# Patient Record
Sex: Male | Born: 1960 | Race: White | Hispanic: No | Marital: Married | Smoking: Current every day smoker
Health system: Southern US, Community
[De-identification: ages and names within clinical notes are randomized; demographics above are authoritative.]

---

## 2018-06-06 ENCOUNTER — Emergency Department (INDEPENDENT_AMBULATORY_CARE_PROVIDER_SITE_OTHER)
Admission: EM | Admit: 2018-06-06 | Discharge: 2018-06-06 | Disposition: A | Discharge status: Home / Self Care | Payer: BLUE CROSS/BLUE SHIELD | Source: Home / Self Care | Attending: Family Medicine | Admitting: Family Medicine

## 2018-06-06 ENCOUNTER — Emergency Department (INDEPENDENT_AMBULATORY_CARE_PROVIDER_SITE_OTHER): Payer: BLUE CROSS/BLUE SHIELD

## 2018-06-06 ENCOUNTER — Other Ambulatory Visit: Payer: Self-pay

## 2018-06-06 DIAGNOSIS — R42 Dizziness and giddiness: Secondary | ICD-10-CM

## 2018-06-06 DIAGNOSIS — J323 Chronic sphenoidal sinusitis: Secondary | ICD-10-CM

## 2018-06-06 DIAGNOSIS — J0130 Acute sphenoidal sinusitis, unspecified: Secondary | ICD-10-CM

## 2018-06-06 MED ORDER — MECLIZINE HCL 25 MG PO TABS: 25.0000 mg | ORAL_TABLET | Freq: Three times a day (TID) | ORAL | 0 refills | Status: AC | PRN

## 2018-06-06 MED ORDER — AMOXICILLIN-POT CLAVULANATE 875-125 MG PO TABS: 1.0000 | ORAL_TABLET | Freq: Two times a day (BID) | ORAL | 0 refills | Status: AC

## 2018-06-06 NOTE — ED Provider Notes (Signed)
[facility]    CSN: [id number] Arrival date & time: 06/06/18  1139     History   Chief Complaint Chief Complaint  Patient presents with  . Dizziness  . Nausea  . Otalgia  . Facial Pain    HPI Joshua Rubio is a 57 y.o. male.   HPI  Joshua Rubio is a 57 y.o. male presenting to UC with c/o dizziness that started 2 days ago.  Pt states he had been working under a car on Thursday and Friday of last week.  The next morning he developed dizziness in the middle of the night with severe nausea and headache.  The dizziness resolved within 2 hours and he felt a "flush" feeling all over his body.  Dizziness and nausea have gradually subsided but he still feels slightly nauseated and dizzy with associated Right sided ear pressure, nasal congestion and Right sided sinus pressure. Denies fever, chills, vomiting or diarrhea. Denies known sick contacts. He has not seen a PCP in "years."  He has been told his BP has been high but has never been put on medication. No prior hx of stroke. No family hx of stroke. No hx of migraine or vertigo but states he has had similar dizzy sensation while initially getting out from working under cars but it typically resolves within a few minutes. No family hx of migraines.    History reviewed. No pertinent past medical history.  There are no active problems to display for this patient.   History reviewed. No pertinent surgical history.     Home Medications    Prior to Admission medications   Medication Sig Start Date End Date Taking? Authorizing Provider  methadone (DOLOPHINE) 10 MG tablet Take 125 mg by mouth daily.   Yes [provider]  amoxicillin-clavulanate (AUGMENTIN) 875-125 MG tablet Take 1 tablet by mouth 2 (two) times daily. One po bid x 7 days 06/06/18   Lurene Shadow, PA-C  meclizine (ANTIVERT) 25 MG tablet Take 1 tablet (25 mg total) by mouth 3 (three) times daily as needed for dizziness. 06/06/18   Lurene Shadow,  PA-C    Family History History reviewed. No pertinent family history.  Social History Social History   Tobacco Use  . Smoking status: Current Every Day Smoker    Packs/day: 2.00    Types: Cigarettes  Substance Use Topics  . Alcohol use: Not Currently  . Drug use: Not Currently     Allergies   Patient has no known allergies.   Review of Systems Review of Systems  Constitutional: Negative for chills and fever.  HENT: Positive for congestion, ear pain and sinus pressure. Negative for sinus pain and sore throat.   Eyes: Negative for photophobia and visual disturbance.  Respiratory: Negative for cough and chest tightness.   Cardiovascular: Negative for chest pain and palpitations.  Gastrointestinal: Positive for nausea. Negative for vomiting.  Musculoskeletal: Negative for myalgias, neck pain and neck stiffness.  Neurological: Positive for dizziness and headaches. Negative for tremors, seizures, syncope, facial asymmetry, speech difficulty, weakness, light-headedness and numbness.     Physical Exam Triage Vital Signs ED Triage Vitals  Enc Vitals Group     BP 06/06/18 1232 (!) 159/92     Pulse Rate 06/06/18 1232 66     Resp --      Temp 06/06/18 1232 98 F (36.7 C)     Temp Source 06/06/18 1232 Oral     SpO2 06/06/18 1232 96 %  Weight 06/06/18 1233 251 lb (113.9 kg)     Height 06/06/18 1233 6\' 3"  (1.905 m)     Head Circumference --      Peak Flow --      Pain Score 06/06/18 1233 0     Pain Loc --      Pain Edu? --      Excl. in GC? --    No data found.  Updated Vital Signs BP (!) 159/92 (BP Location: Right Arm)   Pulse 66   Temp 98 F (36.7 C) (Oral)   Ht 6\' 3"  (1.905 m)   Wt 251 lb (113.9 kg)   SpO2 96%   BMI 31.37 kg/m   Visual Acuity Right Eye Distance:   Left Eye Distance:   Bilateral Distance:    Right Eye Near:   Left Eye Near:    Bilateral Near:     Physical Exam  Constitutional: He is oriented to person, place, and time. He appears  well-developed and well-nourished. No distress.  HENT:  Head: Normocephalic and atraumatic.  Right Ear: Tympanic membrane normal.  Left Ear: Tympanic membrane normal.  Nose: Mucosal edema present. Right sinus exhibits maxillary sinus tenderness. Right sinus exhibits no frontal sinus tenderness. Left sinus exhibits no maxillary sinus tenderness and no frontal sinus tenderness.  Mouth/Throat: Uvula is midline, oropharynx is clear and moist and mucous membranes are normal.  Eyes: Pupils are equal, round, and reactive to light. Conjunctivae are normal. Right eye exhibits nystagmus. Left eye exhibits nystagmus.  Slight horizontal nystagmus to the Right  Neck: Normal range of motion. Neck supple.  Cardiovascular: Normal rate and regular rhythm.  Pulmonary/Chest: Effort normal and breath sounds normal. No stridor. No respiratory distress. He has no wheezes. He has no rales.  Musculoskeletal: Normal range of motion.  Neurological: He is alert and oriented to person, place, and time. No cranial nerve deficit.  CN II-XII in tact. Speech is clear. Alert to person, place and time. Normal finger to nose coordination. Negative pronator drift. Normal gait.   Skin: Skin is warm and dry. He is not diaphoretic.  Psychiatric: He has a normal mood and affect. His behavior is normal.  Nursing note and vitals reviewed.    UC Treatments / Results  Labs (all labs ordered are listed, but only abnormal results are displayed) Labs Reviewed - No data to display  EKG None  Radiology Ct Head Wo Contrast  Result Date: 06/06/2018 CLINICAL DATA:  Headaches, right maxillary pressure. EXAM: CT HEAD WITHOUT CONTRAST CT MAXILLOFACIAL WITHOUT CONTRAST TECHNIQUE: Multidetector CT imaging of the head and maxillofacial structures were performed using the standard protocol without intravenous contrast. Multiplanar CT image reconstructions of the maxillofacial structures were also generated. COMPARISON:  None. FINDINGS: CT  HEAD FINDINGS Brain: No evidence of acute infarction, hemorrhage, hydrocephalus, extra-axial collection or mass lesion/mass effect. Vascular: No hyperdense vessel or unexpected calcification. Skull: Normal. Negative for fracture or focal lesion. Other: None. CT MAXILLOFACIAL FINDINGS Osseous: No fracture is noted in the maxillofacial region. Mandible is not included in the field-of-view. Orbits: Negative. No traumatic or inflammatory finding. Sinuses: Left sphenoid sinusitis is noted. Remaining paranasal sinuses are unremarkable. Soft tissues: Negative. IMPRESSION: Normal head CT. Left sphenoid sinusitis. No other abnormality seen in visualized maxillofacial region. Electronically Signed   By: Lupita Raider, M.D.   On: 06/06/2018 14:08   Ct Maxillofacial Wo Contrast  Result Date: 06/06/2018 CLINICAL DATA:  Headaches, right maxillary pressure. EXAM: CT HEAD WITHOUT CONTRAST CT MAXILLOFACIAL WITHOUT  CONTRAST TECHNIQUE: Multidetector CT imaging of the head and maxillofacial structures were performed using the standard protocol without intravenous contrast. Multiplanar CT image reconstructions of the maxillofacial structures were also generated. COMPARISON:  None. FINDINGS: CT HEAD FINDINGS Brain: No evidence of acute infarction, hemorrhage, hydrocephalus, extra-axial collection or mass lesion/mass effect. Vascular: No hyperdense vessel or unexpected calcification. Skull: Normal. Negative for fracture or focal lesion. Other: None. CT MAXILLOFACIAL FINDINGS Osseous: No fracture is noted in the maxillofacial region. Mandible is not included in the field-of-view. Orbits: Negative. No traumatic or inflammatory finding. Sinuses: Left sphenoid sinusitis is noted. Remaining paranasal sinuses are unremarkable. Soft tissues: Negative. IMPRESSION: Normal head CT. Left sphenoid sinusitis. No other abnormality seen in visualized maxillofacial region. Electronically Signed   By: Lupita RaiderJames  Green Jr, M.D.   On: 06/06/2018 14:08     Procedures Procedures (including critical care time)  Medications Ordered in UC Medications - No data to display  Initial Impression / Assessment and Plan / UC Course  I have reviewed the triage vital signs and the nursing notes.  Pertinent labs & imaging results that were available during my care of the patient were reviewed by me and considered in my medical decision making (see chart for details).     Discussed imaging with pt.  Normal neuro exam. No evidence of emergent process taking place at this time.  Dizziness c/w vertigo, likely also due to sphenoid sinusitis. Will tx with Augmentin and Meclizine. Discussed symptoms that warrant emergent care in the ED.   Final Clinical Impressions(s) / UC Diagnoses   Final diagnoses:  Dizziness  Acute non-recurrent sphenoidal sinusitis     Discharge Instructions      Please call to schedule a follow up exam with a family medicine provider this week.  Please call 911 or go to the hospital if symptoms worsening- if headache returns, one sided weakness or numbness, worsening dizziness, confusion, change in speech or balance, or other new concerning symptoms develop.    ED Prescriptions    Medication Sig Dispense Auth. Provider   amoxicillin-clavulanate (AUGMENTIN) 875-125 MG tablet Take 1 tablet by mouth 2 (two) times daily. One po bid x 7 days 14 tablet Derron Pipkins O, PA-C   meclizine (ANTIVERT) 25 MG tablet Take 1 tablet (25 mg total) by mouth 3 (three) times daily as needed for dizziness. 30 tablet Lurene ShadowPhelps, Jamarri Vuncannon O, PA-C     Controlled Substance Prescriptions [place] Controlled Substance Registry consulted? Not Applicable   Rolla Platehelps, Mckinzee Spirito O, PA-C 06/07/18 [id number]

## 2018-06-06 NOTE — Discharge Instructions (Signed)
°  Please call to schedule a follow up exam with a family medicine provider this week.  Please call 911 or go to the hospital if symptoms worsening- if headache returns, one sided weakness or numbness, worsening dizziness, confusion, change in speech or balance, or other new concerning symptoms develop.

## 2018-06-06 NOTE — ED Triage Notes (Signed)
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  He was upside down during most of those days.  Had some dizziness during this time.  Saturday night woke up with severe nausea, severe headache and dizziness.  Resolved after 2 hours, body felt flush like " all the blood left my body".  Yesterday and today sx have become less, but still nauseated, dizzy, and R ear pain and R nasal congestion/pressure.

## 2018-06-07 ENCOUNTER — Telehealth: Payer: Self-pay

## 2018-06-07 NOTE — Telephone Encounter (Signed)
Left message on VM with contact information to call if any questions or concerns. 

## 2018-10-15 ENCOUNTER — Emergency Department (INDEPENDENT_AMBULATORY_CARE_PROVIDER_SITE_OTHER): Payer: BLUE CROSS/BLUE SHIELD

## 2018-10-15 ENCOUNTER — Encounter: Payer: Self-pay | Admitting: Emergency Medicine

## 2018-10-15 ENCOUNTER — Emergency Department (INDEPENDENT_AMBULATORY_CARE_PROVIDER_SITE_OTHER)
Admission: EM | Admit: 2018-10-15 | Discharge: 2018-10-15 | Disposition: A | Discharge status: Home / Self Care | Payer: BLUE CROSS/BLUE SHIELD | Source: Home / Self Care

## 2018-10-15 ENCOUNTER — Other Ambulatory Visit: Payer: Self-pay

## 2018-10-15 DIAGNOSIS — R05 Cough: Secondary | ICD-10-CM

## 2018-10-15 DIAGNOSIS — R079 Chest pain, unspecified: Secondary | ICD-10-CM

## 2018-10-15 DIAGNOSIS — R0602 Shortness of breath: Secondary | ICD-10-CM | POA: Diagnosis not present

## 2018-10-15 DIAGNOSIS — R030 Elevated blood-pressure reading, without diagnosis of hypertension: Secondary | ICD-10-CM

## 2018-10-15 DIAGNOSIS — J101 Influenza due to other identified influenza virus with other respiratory manifestations: Secondary | ICD-10-CM | POA: Diagnosis not present

## 2018-10-15 LAB — POCT INFLUENZA A/B
Influenza A, POC: POSITIVE — AB
Influenza B, POC: NEGATIVE

## 2018-10-15 MED ORDER — OSELTAMIVIR PHOSPHATE 75 MG PO CAPS: 75.0000 mg | ORAL_CAPSULE | Freq: Two times a day (BID) | ORAL | 0 refills | Status: AC

## 2018-10-15 NOTE — ED Provider Notes (Signed)
[facility]    CSN: [id number] Arrival date & time: 10/15/18  1722     History   Chief Complaint Chief Complaint  Patient presents with  . Cough  . Otalgia    HPI Ronne BinningClarence Sens is a 58 y.o. male.   HPI  Ronne BinningClarence Niziolek is a 58 y.o. male presenting to UC with c/o 3 days worsening cough, congestion, chest tightness and mild SOB that started today.  Body aches and ear pain are 8/10, aching and sore. He went to work today but symptoms suddenly worsened when he got off work. He has taken Dayquil and Nyquil with mild relief. Other family members were dx with flu a few days ago. He did not receive flu vaccine. No hx of heart problems.   BP elevated in triage. He has been told the last few visits to doctor his BP is high but he does not have a PCP.  He is not prescribed any BP medications.    History reviewed. No pertinent past medical history.  There are no active problems to display for this patient.   History reviewed. No pertinent surgical history.     Home Medications    Prior to Admission medications   Medication Sig Start Date End Date Taking? Authorizing Provider  amoxicillin-clavulanate (AUGMENTIN) 875-125 MG tablet Take 1 tablet by mouth 2 (two) times daily. One po bid x 7 days 06/06/18   Lurene ShadowPhelps, Keniya Schlotterbeck O, PA-C  meclizine (ANTIVERT) 25 MG tablet Take 1 tablet (25 mg total) by mouth 3 (three) times daily as needed for dizziness. 06/06/18   Lurene ShadowPhelps, Emri Sample O, PA-C  methadone (DOLOPHINE) 10 MG tablet Take 125 mg by mouth daily.    [provider]  oseltamivir (TAMIFLU) 75 MG capsule Take 1 capsule (75 mg total) by mouth every 12 (twelve) hours. 10/15/18   Lurene ShadowPhelps, Enedelia Martorelli O, PA-C    Family History History reviewed. No pertinent family history.  Social History Social History   Tobacco Use  . Smoking status: Current Every Day Smoker    Packs/day: 2.00    Types: Cigarettes  Substance Use Topics  . Alcohol use: Not Currently  . Drug use: Not Currently       Allergies   Patient has no known allergies.   Review of Systems Review of Systems  Constitutional: Positive for chills, diaphoresis and fatigue. Negative for fever.  HENT: Positive for congestion. Negative for ear pain, sore throat, trouble swallowing and voice change.   Respiratory: Positive for cough, chest tightness and shortness of breath.   Cardiovascular: Positive for chest pain. Negative for palpitations.  Gastrointestinal: Negative for abdominal pain, diarrhea, nausea and vomiting.  Musculoskeletal: Positive for arthralgias, back pain and myalgias.  Skin: Negative for rash.     Physical Exam Triage Vital Signs ED Triage Vitals [10/15/18 1752]  Enc Vitals Group     BP (!) 180/90     Pulse Rate 82     Resp      Temp 98.5 F (36.9 C)     Temp Source Oral     SpO2 95 %     Weight      Height      Head Circumference      Peak Flow      Pain Score 8     Pain Loc      Pain Edu?      Excl. in GC?    No data found.  Updated Vital Signs BP (!) 179/104 (BP Location: Left  Arm)   Pulse 77   Temp 98.5 F (36.9 C) (Oral)   Ht 6\' 2"  (1.88 m)   Wt 251 lb 6.4 oz (114 kg)   SpO2 95%   BMI 32.28 kg/m   Visual Acuity Right Eye Distance:   Left Eye Distance:   Bilateral Distance:    Right Eye Near:   Left Eye Near:    Bilateral Near:     Physical Exam Vitals signs and nursing note reviewed.  Constitutional:      Appearance: Normal appearance. He is well-developed. He is obese. He is diaphoretic.  HENT:     Head: Normocephalic and atraumatic.     Right Ear: Tympanic membrane normal.     Left Ear: Tympanic membrane normal.     Nose: Nose normal.     Mouth/Throat:     Lips: Pink.     Mouth: Mucous membranes are moist.     Pharynx: Oropharynx is clear. Uvula midline.  Neck:     Musculoskeletal: Normal range of motion.  Cardiovascular:     Rate and Rhythm: Normal rate and regular rhythm.  Pulmonary:     Effort: Pulmonary effort is normal. No  respiratory distress.     Breath sounds: No stridor. Examination of the right-lower field reveals decreased breath sounds. Examination of the left-lower field reveals decreased breath sounds. Decreased breath sounds present. No wheezing or rhonchi.  Abdominal:     General: Abdomen is flat.     Tenderness: There is no abdominal tenderness.  Musculoskeletal: Normal range of motion.  Skin:    General: Skin is warm.  Neurological:     Mental Status: He is alert and oriented to person, place, and time.  Psychiatric:        Behavior: Behavior normal.      UC Treatments / Results  Labs (all labs ordered are listed, but only abnormal results are displayed) Labs Reviewed  POCT INFLUENZA A/B - Abnormal; Notable for the following components:      Result Value   Influenza A, POC Positive (*)    All other components within normal limits    EKG Date/Time:10/15/2018  18:06:37 Ventricular Rate: 82 PR Interval: 136 QRS Duration: 86 QT Interval: 404 QTC Calculation: 472 P-R-T axes: 72  16  20  Text Interpretation: Normal Sinus rhythm. Normal ECG    Radiology Dg Chest 2 View  Result Date: 10/15/2018 CLINICAL DATA:  Chest pain, shortness of breath, cough, and diaphoresis for 3 days. EXAM: CHEST - 2 VIEW COMPARISON:  None. FINDINGS: The heart size and mediastinal contours are within normal limits. Both lungs are clear. The visualized skeletal structures are unremarkable. IMPRESSION: No active cardiopulmonary disease. Electronically Signed   By: Myles Rosenthal M.D.   On: 10/15/2018 18:48    Procedures Procedures (including critical care time)  Medications Ordered in UC Medications - No data to display  Initial Impression / Assessment and Plan / UC Course  I have reviewed the triage vital signs and the nursing notes.  Pertinent labs & imaging results that were available during my care of the patient were reviewed by me and considered in my medical decision making (see chart for  details).     Rapid flu: POSITIVE A Reassured pt of normal EKG and CXR Will tx for flu A Encouraged f/u with PCP for recheck of today's symptoms as well as monitoring and treatment of his BP  Final Clinical Impressions(s) / UC Diagnoses   Final diagnoses:  Influenza A  Elevated blood pressure reading     Discharge Instructions      You may take 500mg  acetaminophen every 4-6 hours or in combination with ibuprofen 400-600mg  every 6-8 hours as needed for pain, inflammation, and fever.  Be sure to well hydrated with clear liquids and get at least 8 hours of sleep at night, preferably more while sick.   Please follow up with family medicine in 1 week if needed.  Your blood pressure was elevated today. It is very important to establish care with a primary care provider for ongoing healthcare needs including monitoring and treating your high blood pressure. If left untreated, you are at increased risk of stroke, heart attack, kidney failure and even death.     ED Prescriptions    Medication Sig Dispense Auth. Provider   oseltamivir (TAMIFLU) 75 MG capsule Take 1 capsule (75 mg total) by mouth every 12 (twelve) hours. 10 capsule Lurene Shadow, PA-C     Controlled Substance Prescriptions [place] Controlled Substance Registry consulted? Not Applicable   Rolla Plate 10/16/18 1218

## 2018-10-15 NOTE — Discharge Instructions (Signed)
°  You may take 500mg  acetaminophen every 4-6 hours or in combination with ibuprofen 400-600mg  every 6-8 hours as needed for pain, inflammation, and fever.  Be sure to well hydrated with clear liquids and get at least 8 hours of sleep at night, preferably more while sick.   Please follow up with family medicine in 1 week if needed.  Your blood pressure was elevated today. It is very important to establish care with a primary care provider for ongoing healthcare needs including monitoring and treating your high blood pressure. If left untreated, you are at increased risk of stroke, heart attack, kidney failure and even death.

## 2018-10-16 NOTE — ED Triage Notes (Signed)
Here with multiple sx's- started 3 days ago Bilateral otalgia, fever, sore throat, chest tightness.

## 2018-10-18 ENCOUNTER — Telehealth: Payer: Self-pay

## 2018-10-18 NOTE — Telephone Encounter (Signed)
Left voice message inquiring about patients status. Encouraged patient to call with questions or concerns.  

## 2019-12-27 IMAGING — CT CT MAXILLOFACIAL W/O CM
4 of 6 series · 16 of 47 positions shown, 18 images · non-contrast
Comparison: None.

CLINICAL DATA: Headaches, right maxillary pressure.

EXAM:
CT HEAD WITHOUT CONTRAST
CT MAXILLOFACIAL WITHOUT CONTRAST
TECHNIQUE: Multidetector CT imaging of the head and maxillofacial structures
were performed using the standard protocol without intravenous
contrast. Multiplanar CT image reconstructions of the maxillofacial
structures were also generated.

[Series 2: head wo · axial · 0.42mm/px · z∈[-143,-23]mm · 8 of 32 slices shown, 10 images]
[im 4/32  brain]
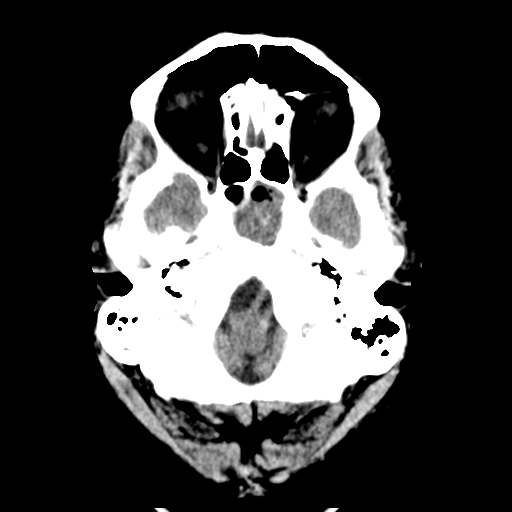
[im 4/32  bone]
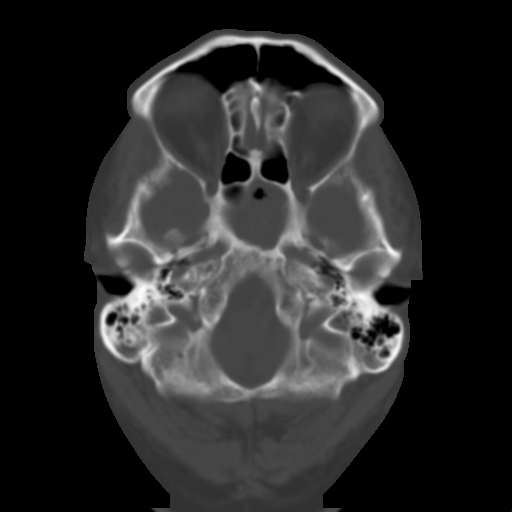
[im 7/32  bone]
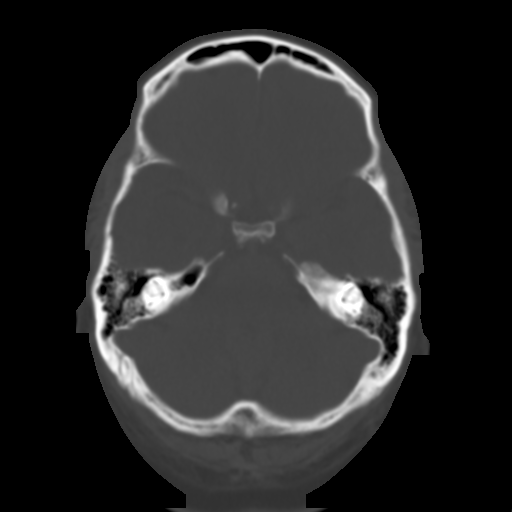
[im 11/32  bone]
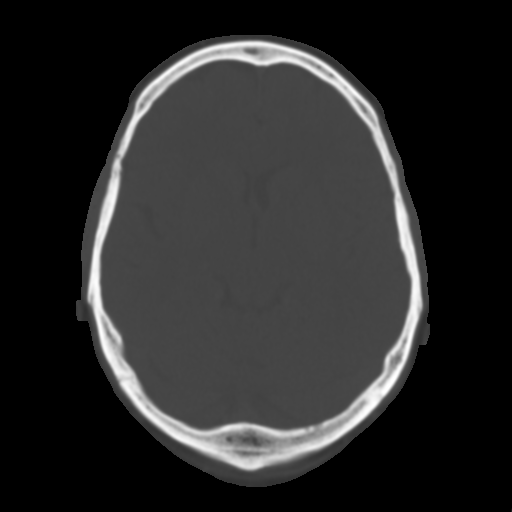
[im 14/32  bone]
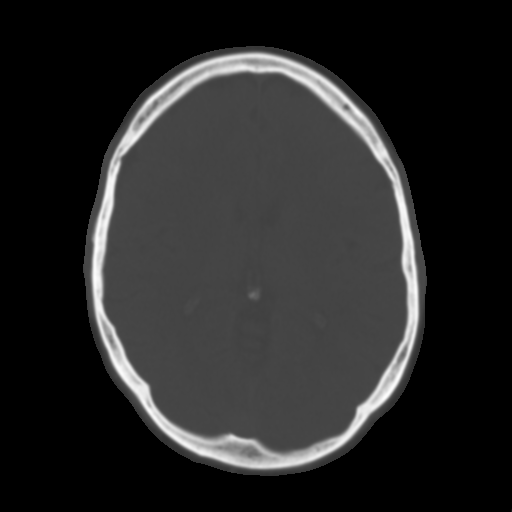
[im 18/32  brain]
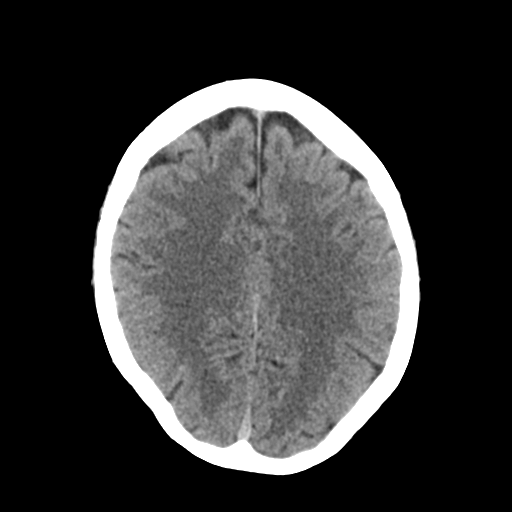
[im 18/32  bone]
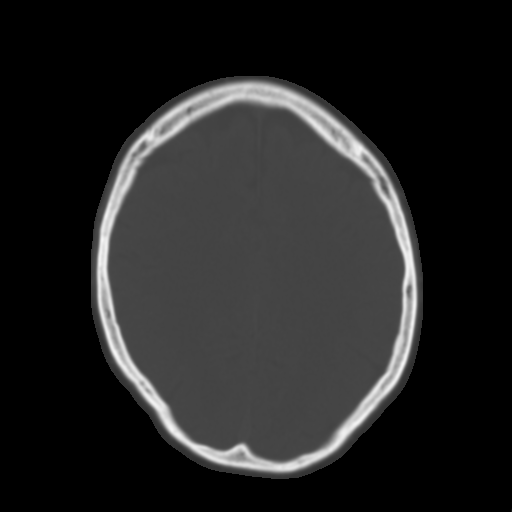
[im 21/32  bone]
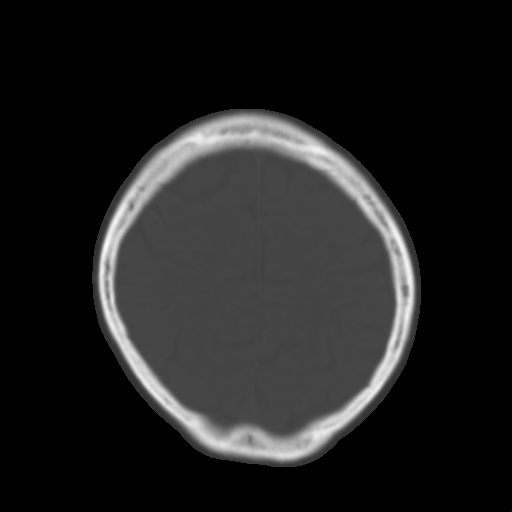
[im 25/32  bone]
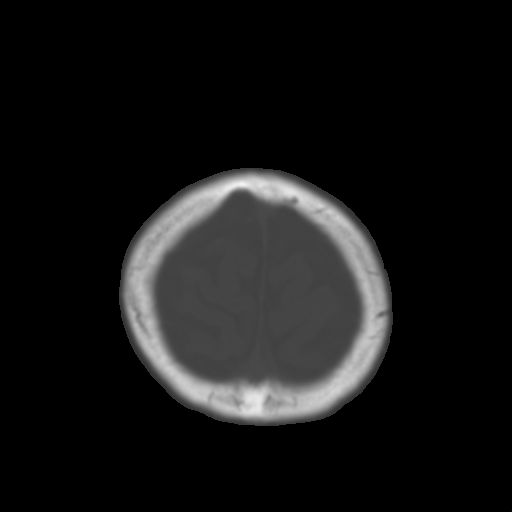
[im 28/32  bone]
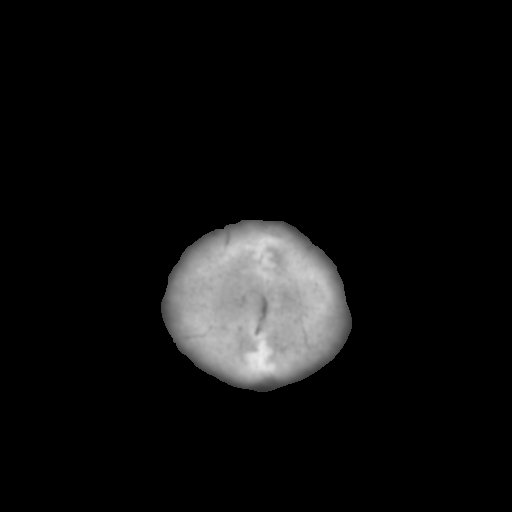

[Series 7: ax bone · axial · 0.33mm/px · z∈[-209,-185]mm · 3 of 60 slices shown]
[im 7/60  bone]
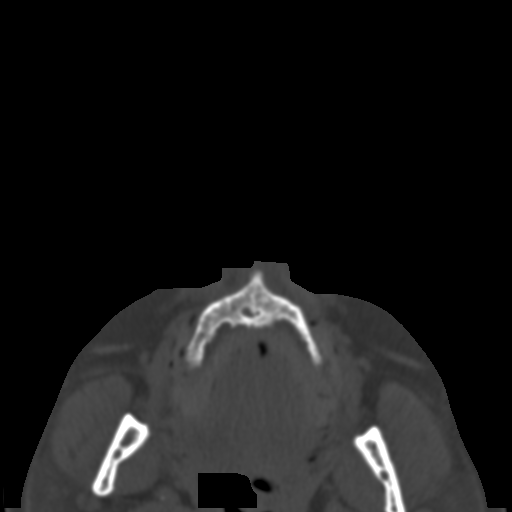
[im 13/60  bone]
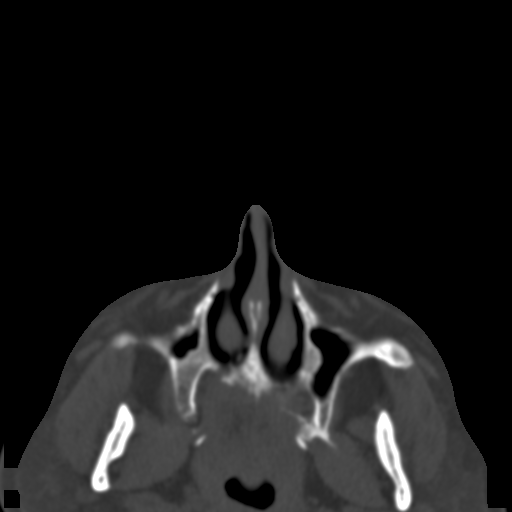
[im 19/60  bone]
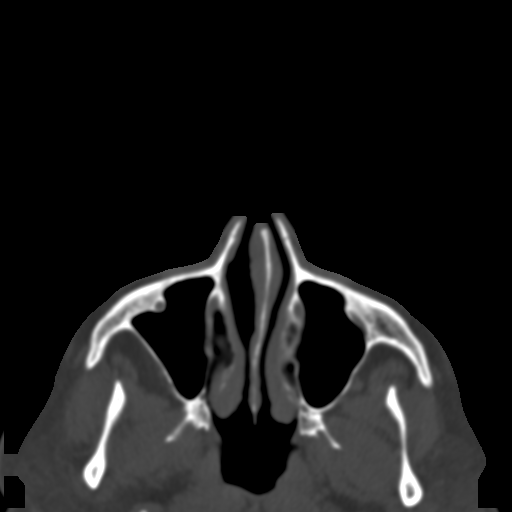

[Series 8: coronal bone · coronal · 0.27mm/px · 3 of 58 slices shown]
[im 15/58  bone]
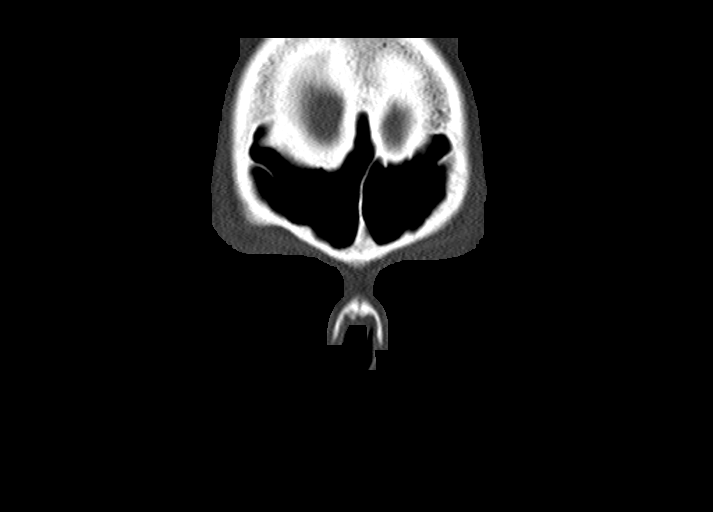
[im 29/58  bone]
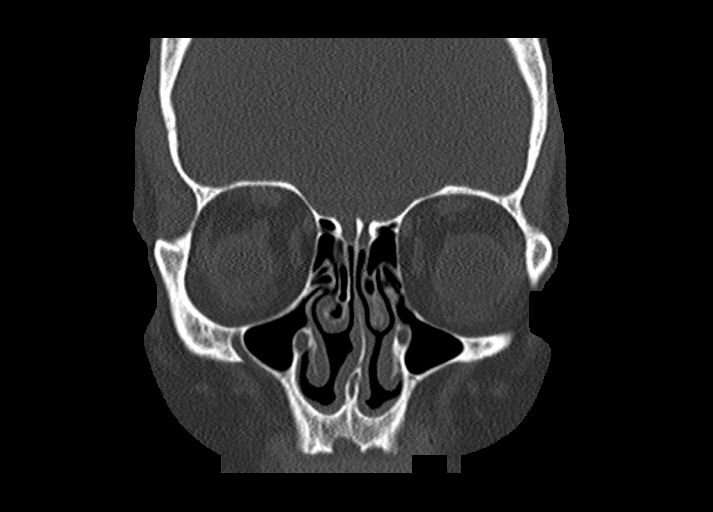
[im 43/58  bone]
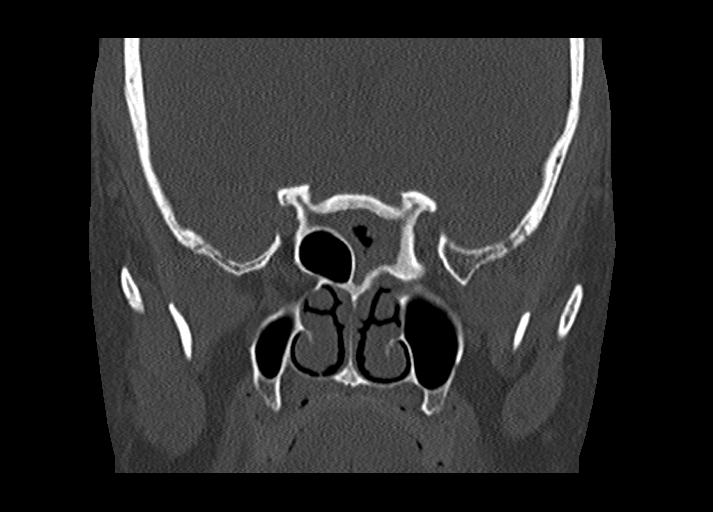

[Series 9: sagittal bone · sagittal · 0.25mm/px · 2 of 87 slices shown]
[im 29/87  bone]
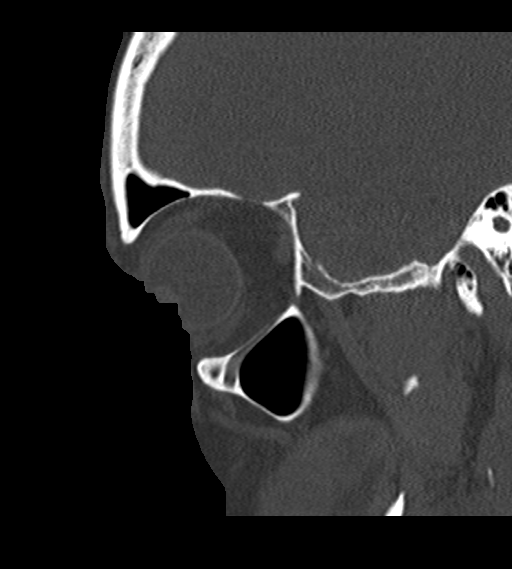
[im 58/87  bone]
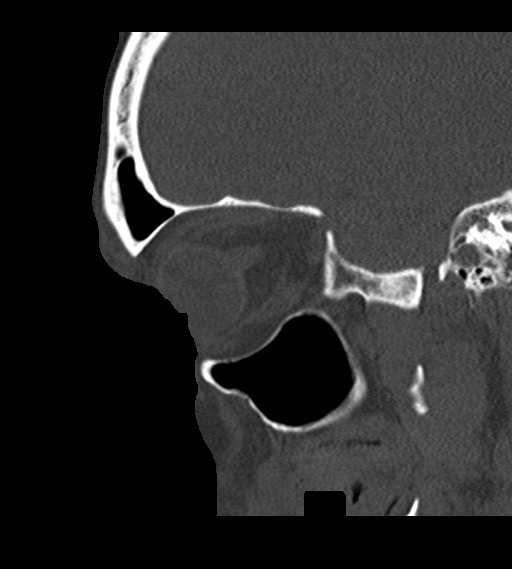

[16 of 47 positions shown; findings below may reference images not displayed]

FINDINGS: CT HEAD FINDINGS

Brain: No evidence of acute infarction, hemorrhage, hydrocephalus,
extra-axial collection or mass lesion/mass effect.

Vascular: No hyperdense vessel or unexpected calcification.

Skull: Normal. Negative for fracture or focal lesion.

Other: None.

CT MAXILLOFACIAL FINDINGS

Osseous: No fracture is noted in the maxillofacial region. Mandible
is not included in the field-of-view.

Orbits: Negative. No traumatic or inflammatory finding.

Sinuses: Left sphenoid sinusitis is noted. Remaining paranasal
sinuses are unremarkable.

Soft tissues: Negative.
IMPRESSION: Normal head CT.

Left sphenoid sinusitis. No other abnormality seen in visualized
maxillofacial region.

## 2020-05-06 IMAGING — DX DG CHEST 2V
2 series · 2 of 2 positions shown · non-contrast
Comparison: None.

CLINICAL DATA: Chest pain, shortness of breath, cough, and
diaphoresis for 3 days.

EXAM:
CHEST - 2 VIEW

[chest pa]
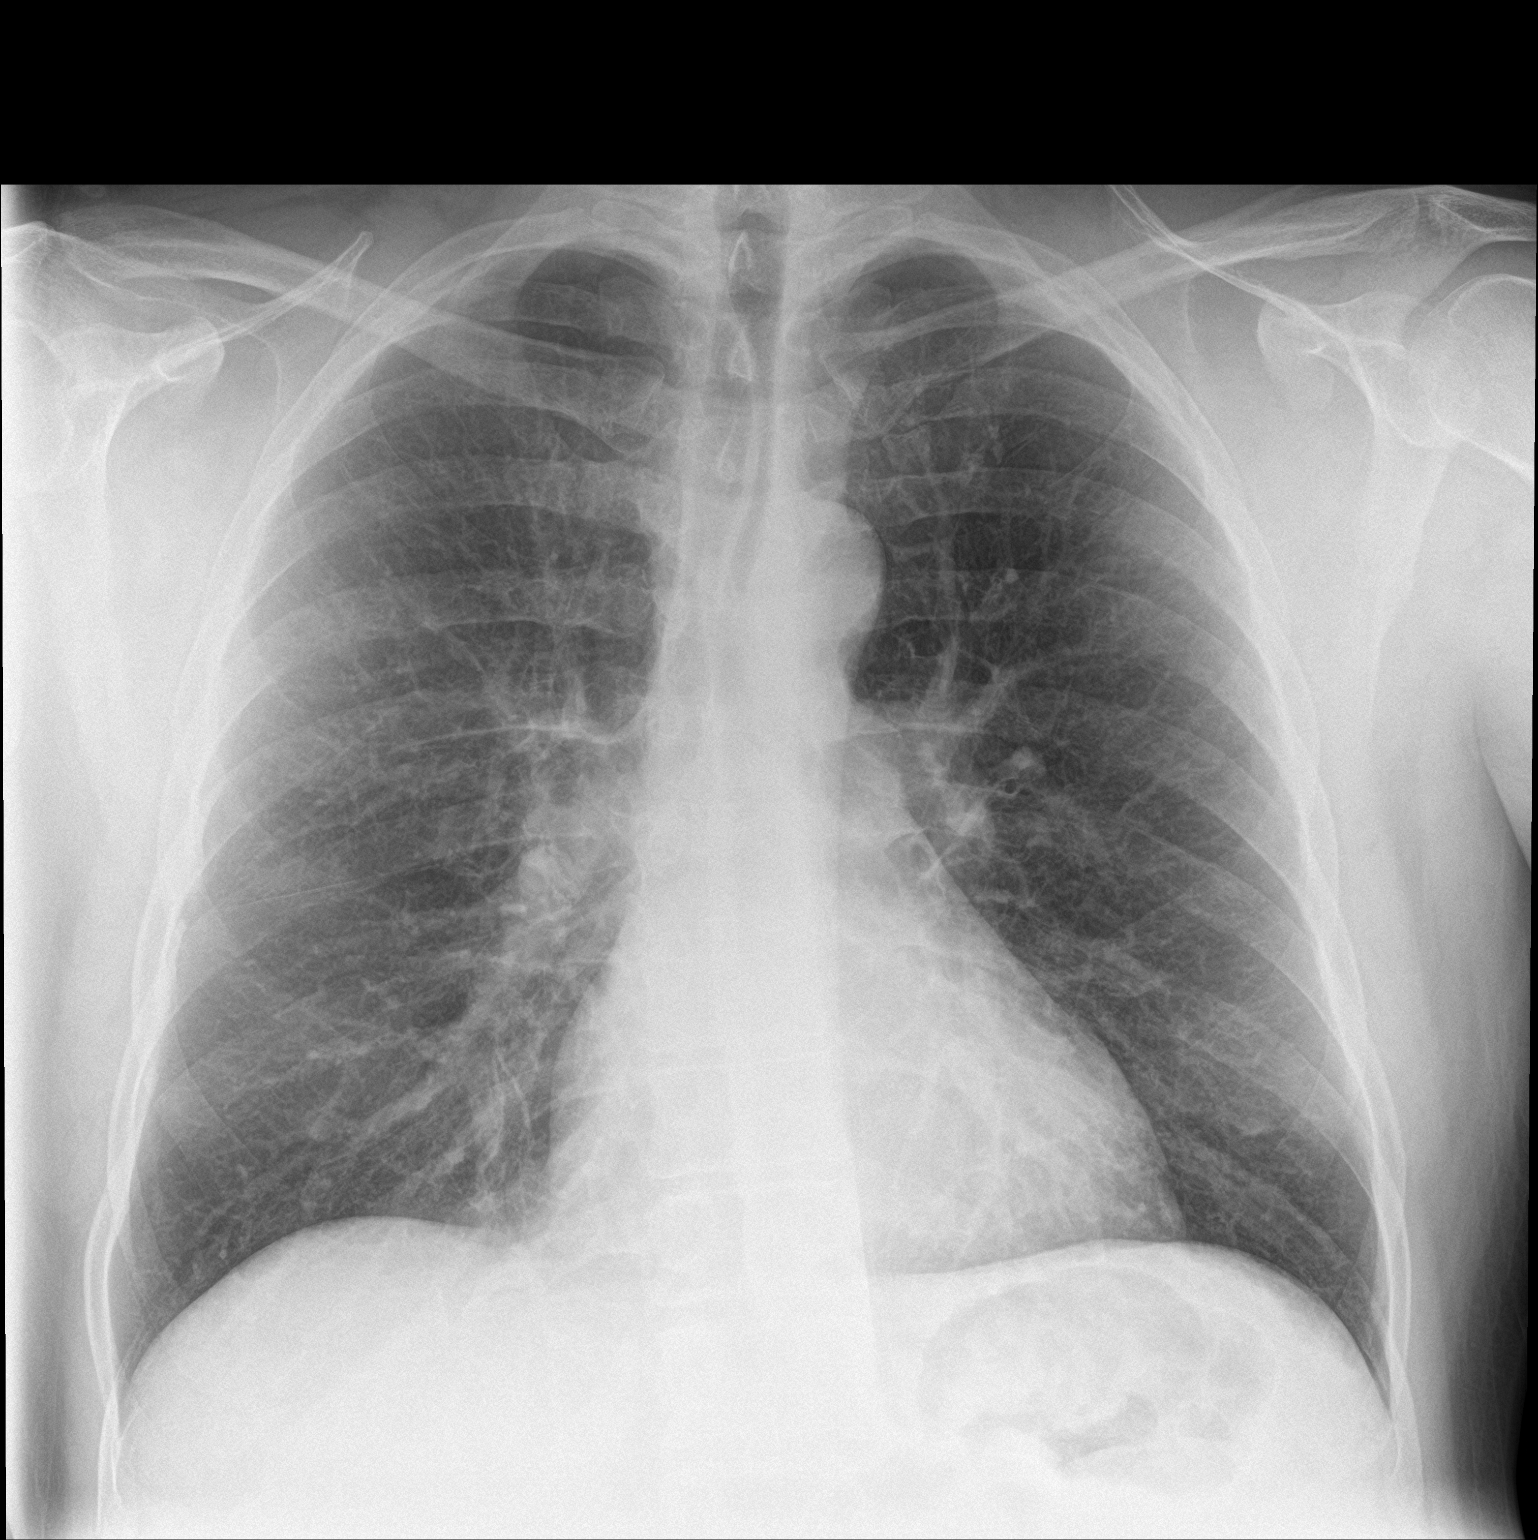

[chest lat]
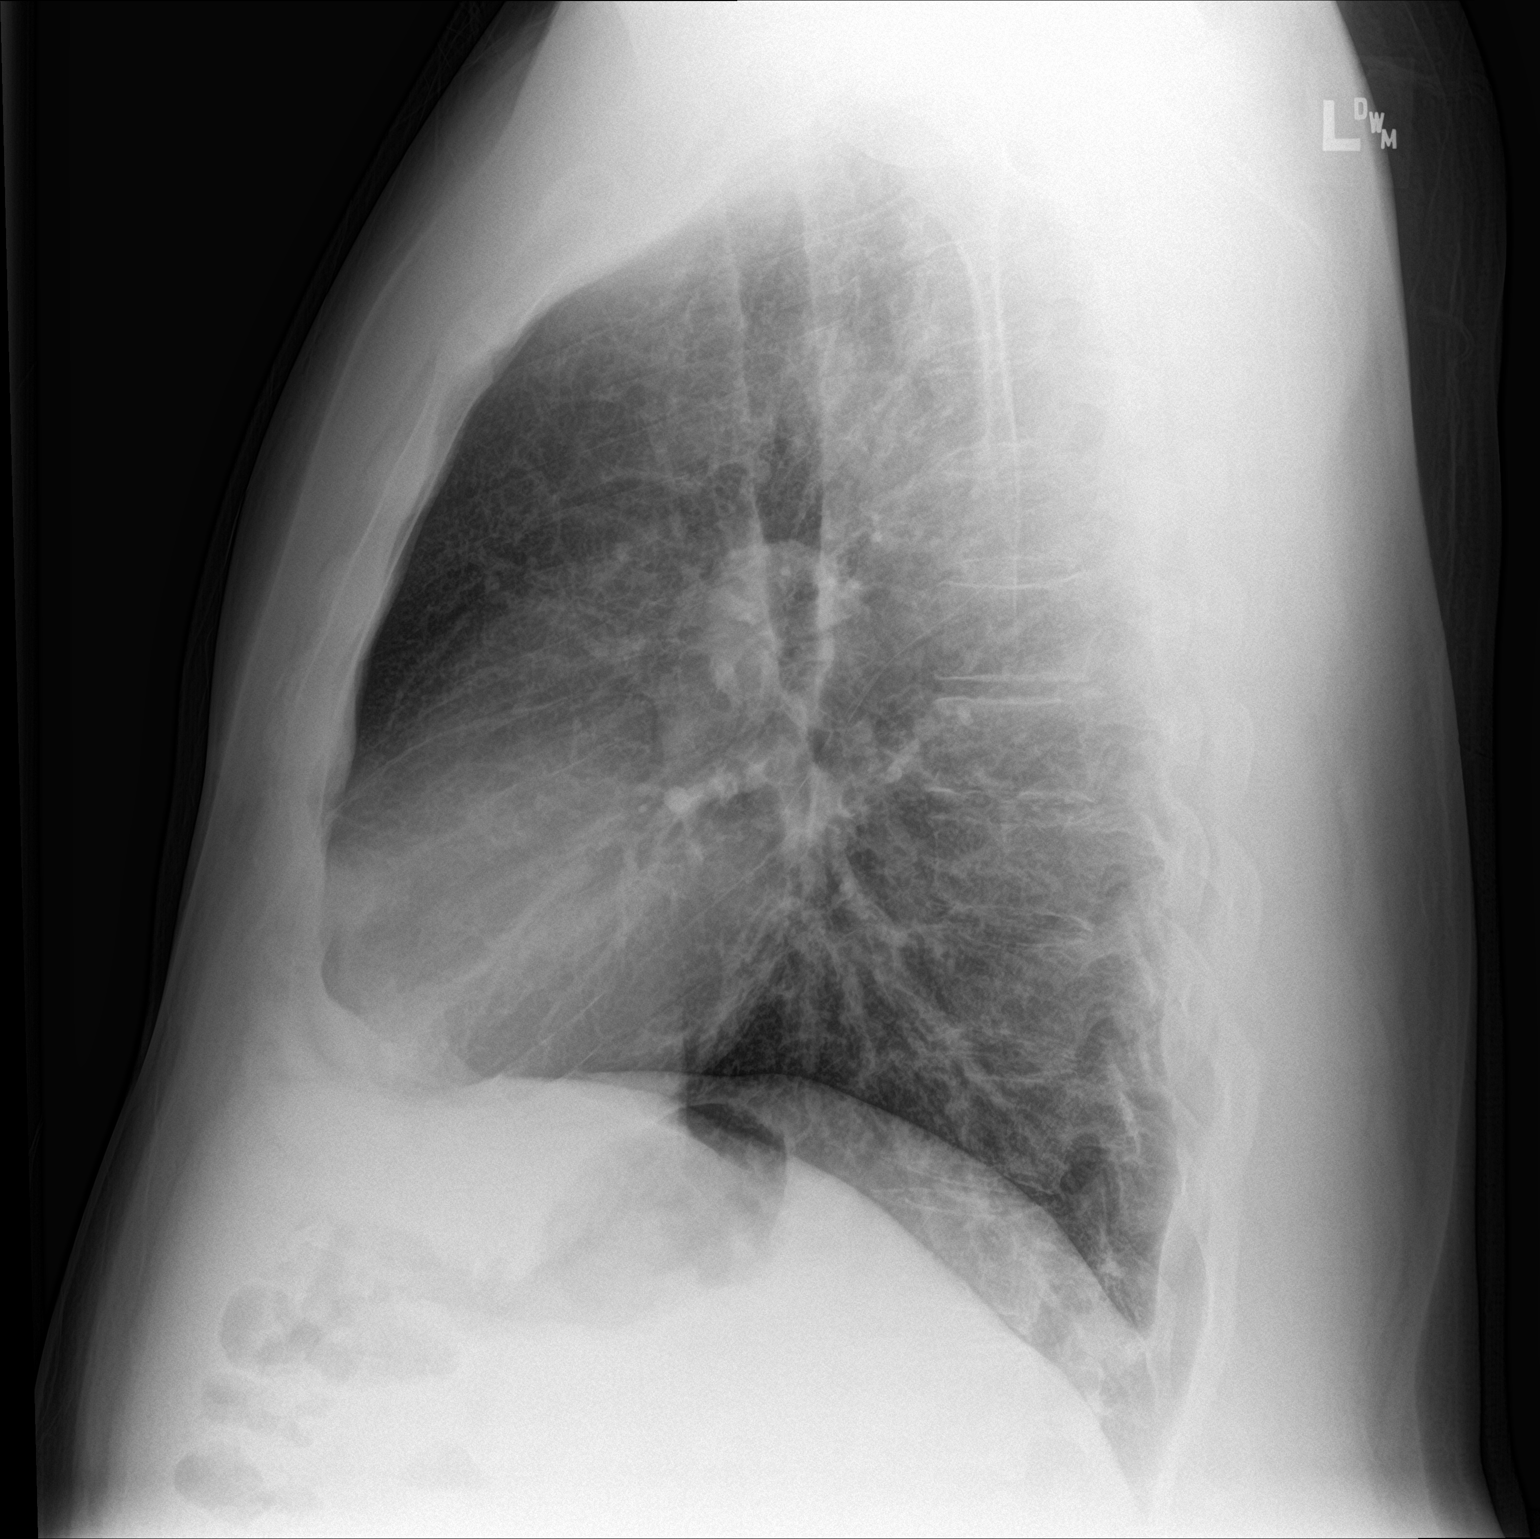

[2 of 2 positions shown; findings below may reference images not displayed]

FINDINGS: The heart size and mediastinal contours are within normal limits.
Both lungs are clear. The visualized skeletal structures are
unremarkable.
IMPRESSION: No active cardiopulmonary disease.
